# Patient Record
Sex: Female | Born: 1970 | Race: Black or African American | Hispanic: No | Marital: Single | Smoking: Never smoker
Health system: Southern US, Community
[De-identification: ages and names within clinical notes are randomized; demographics above are authoritative.]

---

## 2020-07-24 ENCOUNTER — Encounter (HOSPITAL_COMMUNITY): Payer: Self-pay | Admitting: Emergency Medicine

## 2020-07-24 ENCOUNTER — Emergency Department (HOSPITAL_COMMUNITY)
Admission: EM | Admit: 2020-07-24 | Discharge: 2020-07-25 | Disposition: A | Discharge status: Home / Self Care | Payer: No Typology Code available for payment source | Attending: Emergency Medicine | Admitting: Emergency Medicine

## 2020-07-24 ENCOUNTER — Emergency Department (HOSPITAL_COMMUNITY): Payer: No Typology Code available for payment source

## 2020-07-24 ENCOUNTER — Other Ambulatory Visit: Payer: Self-pay

## 2020-07-24 DIAGNOSIS — M62830 Muscle spasm of back: Secondary | ICD-10-CM | POA: Diagnosis not present

## 2020-07-24 DIAGNOSIS — X509XXA Other and unspecified overexertion or strenuous movements or postures, initial encounter: Secondary | ICD-10-CM | POA: Diagnosis not present

## 2020-07-24 DIAGNOSIS — Y9289 Other specified places as the place of occurrence of the external cause: Secondary | ICD-10-CM | POA: Insufficient documentation

## 2020-07-24 DIAGNOSIS — M545 Low back pain, unspecified: Secondary | ICD-10-CM | POA: Diagnosis present

## 2020-07-24 LAB — POC URINE PREG, ED: Preg Test, Ur: NEGATIVE

## 2020-07-24 MED ORDER — KETOROLAC TROMETHAMINE 60 MG/2ML IM SOLN
30.0000 mg | Freq: Once | INTRAMUSCULAR | Status: AC
  Administered 2020-07-25: 30 mg via INTRAMUSCULAR
  Filled 2020-07-24: qty 2

## 2020-07-24 MED ORDER — DEXAMETHASONE 4 MG PO TABS
6.0000 mg | ORAL_TABLET | Freq: Once | ORAL | Status: AC
  Administered 2020-07-25: 6 mg via ORAL
  Filled 2020-07-24: qty 1

## 2020-07-24 NOTE — ED Triage Notes (Signed)
Patient here from home reporting lower back injury today at work. Reports that she was pulling and lifting boxes and felt a pop.

## 2020-07-24 NOTE — ED Provider Notes (Signed)
[facility]-EMERGENCY DEPT Provider Note  CSN: [id number] Arrival date & time: 07/24/20 2227  Chief Complaint(s) Back Pain  HPI Brooke Mason is a 50 y.o. female   The history is provided by the patient.  Back Pain Location:  Lumbar spine and sacro-iliac joint Quality:  Aching, burning and cramping Pain severity:  Severe Onset quality:  Gradual Duration:  5 hours Timing:  Constant Progression:  Waxing and waning Chronicity:  New Context: lifting heavy objects (was pulling heavy box at work when pain started.)   Relieved by:  Being still Worsened by:  Bending, movement, touching, twisting and palpation Associated symptoms: no bladder incontinence, no bowel incontinence, no leg pain, no numbness, no paresthesias, no perianal numbness and no tingling     Past Medical History History reviewed. No pertinent past medical history. There are no problems to display for this patient.  Home Medication(s) Prior to Admission medications   Medication Sig Start Date End Date Taking? Authorizing Provider  cyclobenzaprine (FLEXERIL) 10 MG tablet Take 1 tablet (10 mg total) by mouth at bedtime for 10 days. 07/25/20 08/04/20 Yes Julieann Drummonds, Amadeo Garnet, MD                                                                                                                                    Past Surgical History  The histories are not reviewed yet. Please review them in the "History" navigator section and refresh this SmartLink. Family History No family history on file.  Social History Social History   Tobacco Use  . Smoking status: Never Smoker  . Smokeless tobacco: Never Used  Substance Use Topics  . Alcohol use: Never  . Drug use: Never   Allergies Haldol [haloperidol lactate]  Review of Systems Review of Systems  Gastrointestinal: Negative for bowel incontinence.  Genitourinary: Negative for bladder incontinence.  Musculoskeletal: Positive for  back pain.  Neurological: Negative for tingling, numbness and paresthesias.   All other systems are reviewed and are negative for acute change except as noted in the HPI  Physical Exam Vital Signs  I have reviewed the triage vital signs BP (!) 168/95 (BP Location: Right Arm)   Pulse 89   Temp 98.4 F (36.9 C) (Oral)   Resp 18   Ht 5' 9.5" (1.765 m)   Wt 80.7 kg   SpO2 100%   BMI 25.91 kg/m   Physical Exam Vitals reviewed.  Constitutional:      General: She is not in acute distress.    Appearance: She is well-developed and well-nourished. She is not diaphoretic.  HENT:     Head: Normocephalic and atraumatic.     Right Ear: External ear normal.     Left Ear: External ear normal.     Nose: Nose normal.  Eyes:     General: No scleral icterus.    Extraocular Movements: EOM normal.     Conjunctiva/sclera: Conjunctivae normal.  Neck:     Trachea: Phonation normal.  Cardiovascular:     Rate and Rhythm: Normal rate and regular rhythm.  Pulmonary:     Effort: Pulmonary effort is normal. No respiratory distress.     Breath sounds: No stridor.  Abdominal:     General: There is no distension.  Musculoskeletal:        General: No edema. Normal range of motion.     Cervical back: Normal range of motion.     Lumbar back: Spasms and tenderness present. No bony tenderness.       Back:  Neurological:     Mental Status: She is alert and oriented to person, place, and time.     Comments: Spine Exam: Strength: 5/5 throughout LE bilaterally Sensation: Intact to light touch in proximal and distal LE bilaterally    Psychiatric:        Mood and Affect: Mood and affect normal.        Behavior: Behavior normal.     ED Results and Treatments Labs (all labs ordered are listed, but only abnormal results are displayed) Labs Reviewed  POC URINE PREG, ED                                                                                                                         EKG  EKG  Interpretation  Date/Time:    Ventricular Rate:    PR Interval:    QRS Duration:   QT Interval:    QTC Calculation:   R Axis:     Text Interpretation:        Radiology DG Lumbar Spine Complete  Result Date: 07/24/2020 CLINICAL DATA:  Lower back pain EXAM: LUMBAR SPINE - COMPLETE 4+ VIEW COMPARISON:  None. FINDINGS: There is no evidence of lumbar spine fracture. Alignment is normal. Mild disc height loss and facet arthrosis seen at L4-L5 and L5-S1. Surgical clips seen within the left upper quadrant. 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 Electronically Signed   By: Jonna Clark M.D.   On: 07/24/2020 23:19    Pertinent labs & imaging results that were available during my care of the patient were reviewed by me and considered in my medical decision making (see chart for details).  Medications Ordered in ED Medications  ketorolac (TORADOL) injection 30 mg (30 mg Intramuscular Given 07/25/20 0006)  dexamethasone (DECADRON) tablet 6 mg (6 mg Oral Given 07/25/20 0005)  Procedures Procedures  (including critical care time)  Medical Decision Making / ED Course I have reviewed the nursing notes for this encounter and the patient's prior records (if available in EHR or on provided paperwork).   Brooke Mason was evaluated in Emergency Department on 07/25/2020 for the symptoms described in the history of present illness. She was evaluated in the context of the global COVID-19 pandemic, which necessitated consideration that the patient might be at risk for infection with the SARS-CoV-2 virus that causes COVID-19. Institutional protocols and algorithms that pertain to the evaluation of patients at risk for COVID-19 are in a state of rapid change based on information released by regulatory  bodies including the CDC and federal and state organizations. These policies and algorithms were followed during the patient's care in the ED.  50 y.o. female presents with back pain in lumbar area for 5 hrs without signs of radicular pain. No acute traumatic onset. No red flag symptoms of fever, weight loss, saddle anesthesia, weakness, fecal/urinary incontinence or urinary retention.   Plain film from triage negative.  Suspect MSK etiology.  Patient was recommended to take short course of scheduled NSAIDs and engage in early mobility as definitive treatment. Return precautions discussed for worsening or new concerning symptoms.        Final Clinical Impression(s) / ED Diagnoses Final diagnoses:  Muscle spasm of back   The patient appears reasonably screened and/or stabilized for discharge and I doubt any other medical condition or other [place] requiring further screening, evaluation, or treatment in the ED at this time prior to discharge. Safe for discharge with strict return precautions.  Disposition: Discharge  Condition: Good  I have discussed the results, Dx and Tx plan with the patient/family who expressed understanding and agree(s) with the plan. Discharge instructions discussed at length. The patient/family was given strict return precautions who verbalized understanding of the instructions. No further questions at time of discharge.    ED Discharge Orders         Ordered    cyclobenzaprine (FLEXERIL) 10 MG tablet  Daily at bedtime        07/25/20 0015          Follow Up: Primary care provider  Call  to schedule an appointment for close follow up      This chart was dictated using voice recognition software.  Despite best efforts to proofread,  errors can occur which can change the documentation meaning.   Nira Conn, MD 07/25/20 [phone]

## 2020-07-25 MED ORDER — CYCLOBENZAPRINE HCL 10 MG PO TABS: 10.0000 mg | ORAL_TABLET | Freq: Every day | ORAL | 0 refills | Status: AC

## 2020-07-25 NOTE — Discharge Instructions (Signed)
You may use over-the-counter Motrin (Ibuprofen), Acetaminophen (Tylenol), topical muscle creams such as SalonPas, Icy Hot, Bengay, etc. Please stretch, apply ice or heat (whichever helps), and have massage therapy for additional assistance.  

## 2021-09-05 IMAGING — CR DG LUMBAR SPINE COMPLETE 4+V
5 series · 5 of 5 positions shown · non-contrast
Comparison: None.

CLINICAL DATA: Lower back pain

EXAM:
LUMBAR SPINE - COMPLETE 4+ VIEW

[t lumbar spine ap]
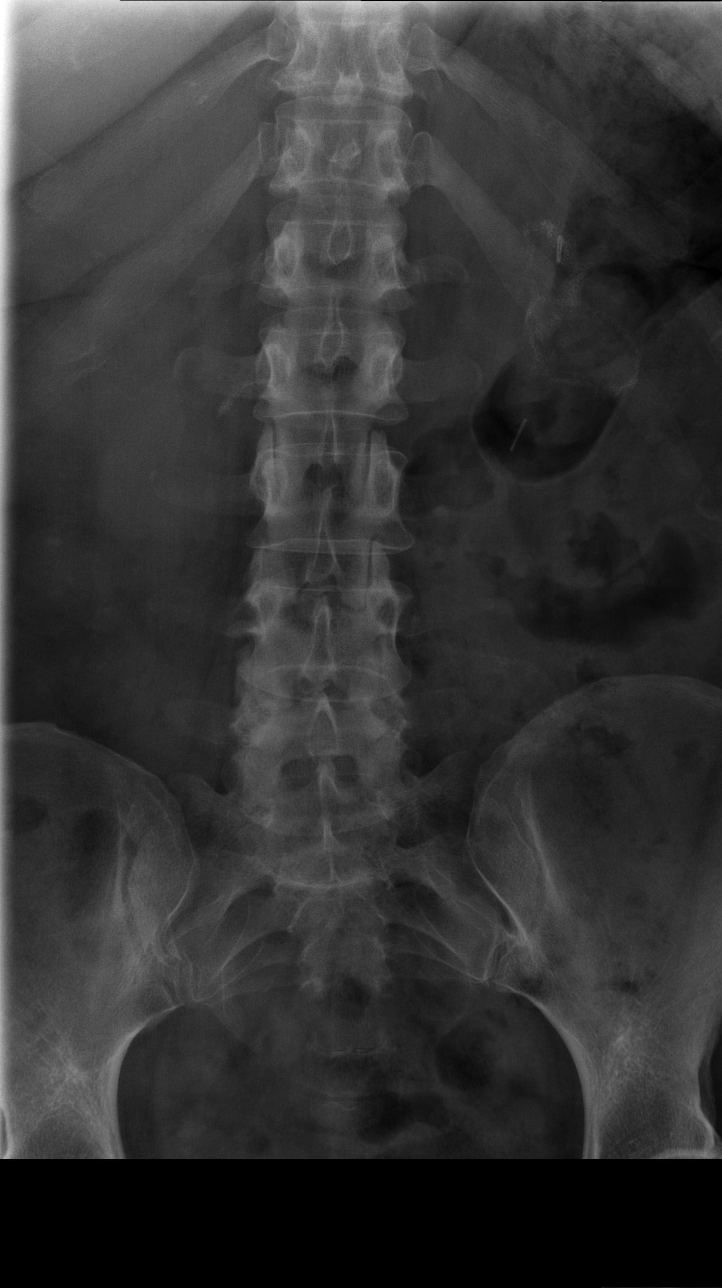

[t lumbar spine obl (1 of 2)]
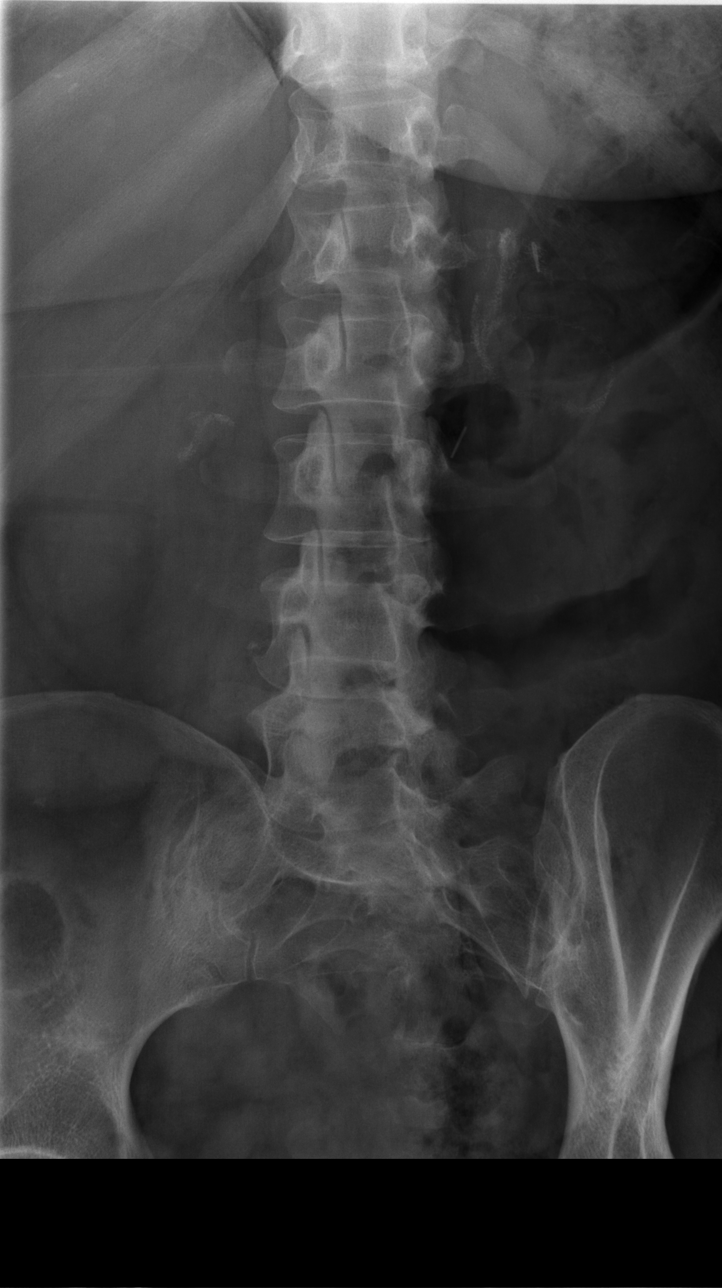

[t lumbar spine obl (2 of 2)]
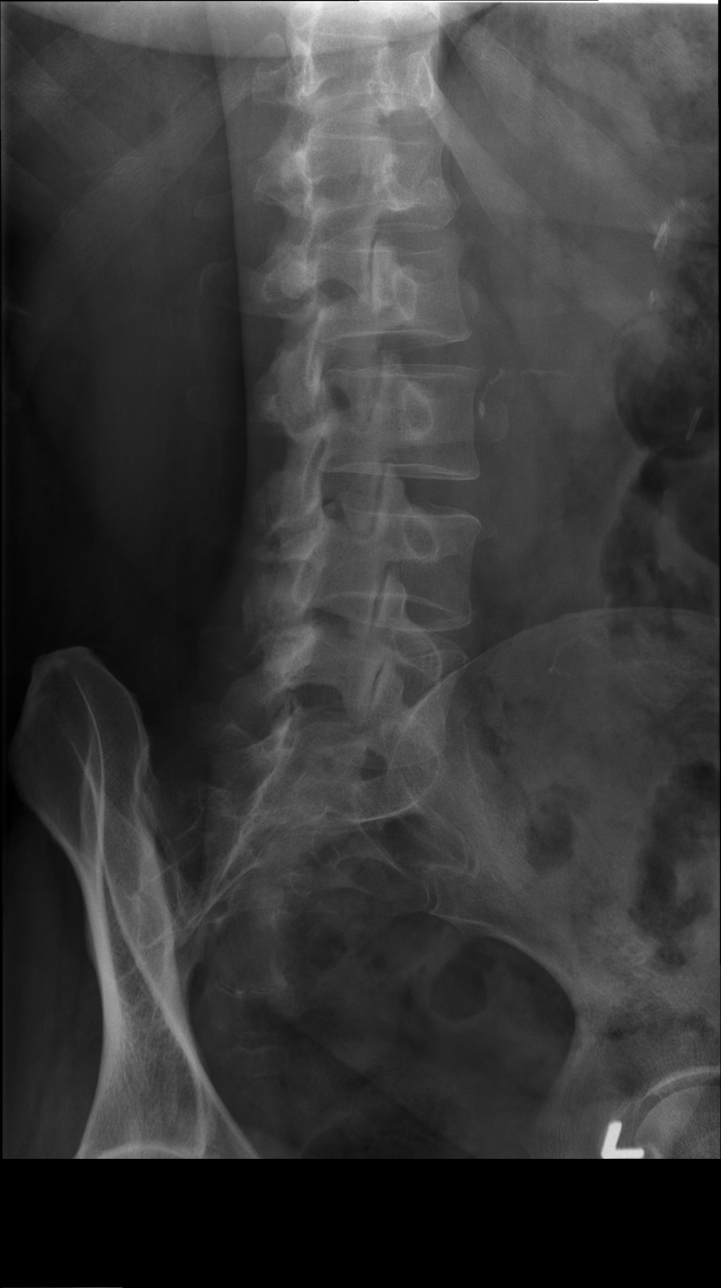

[t lumbar spine lat]
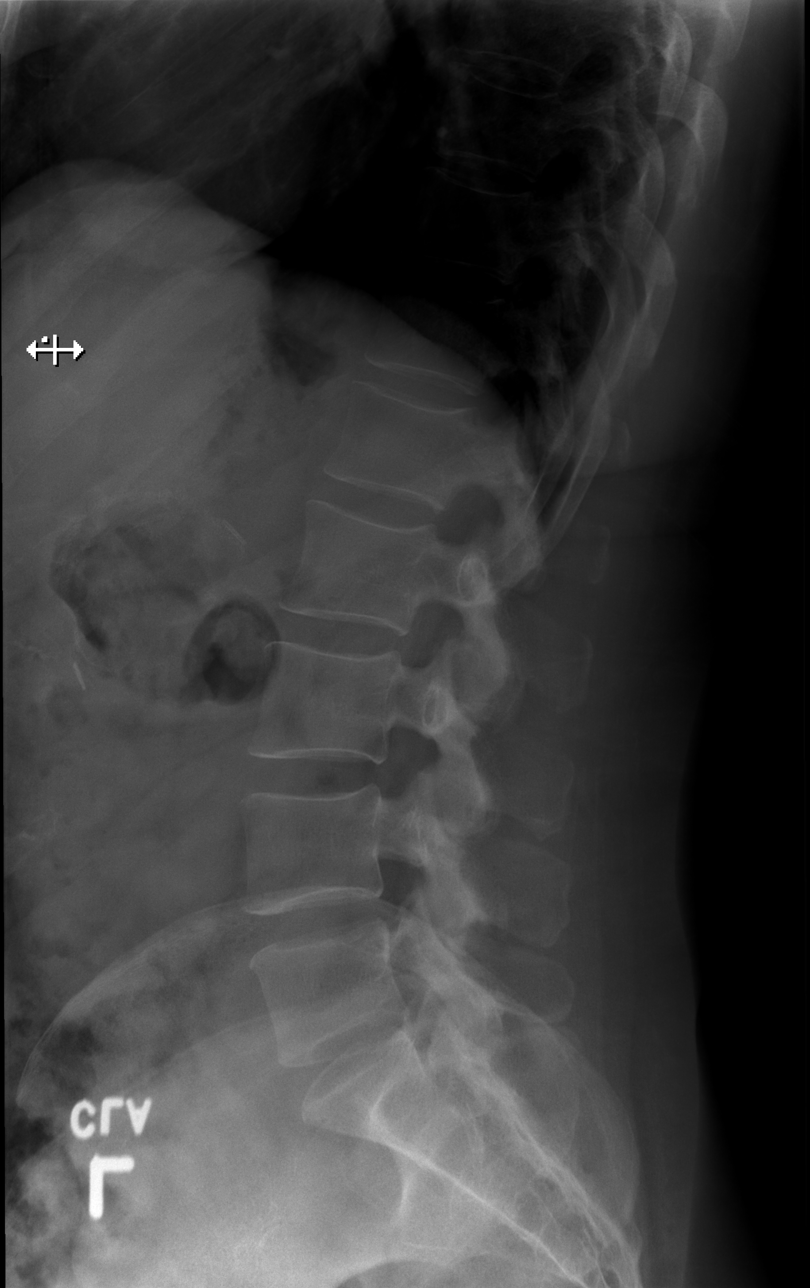

[t lumbar l-5 s-1 spot]
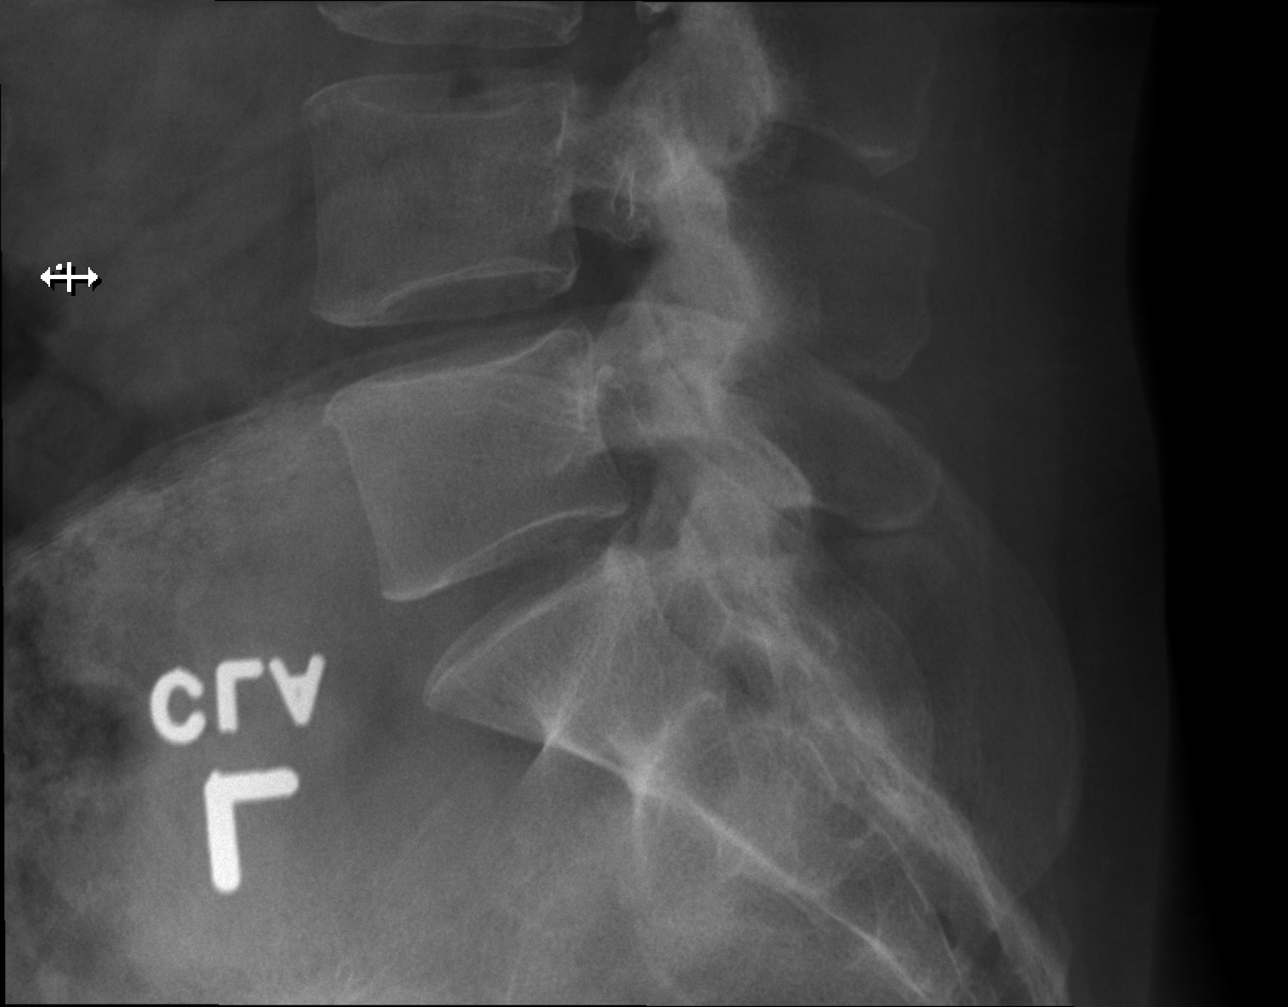

[5 of 5 positions shown; findings below may reference images not displayed]

FINDINGS: There is no evidence of lumbar spine fracture. Alignment is normal.
Mild disc height loss and facet arthrosis seen at L4-L5 and L5-S1.
Surgical clips seen within the left upper quadrant.
IMPRESSION: No acute osseous abnormality

Mild lumbar spine spondylosis most notable at L4-L5 and L5-S1.
